# Patient Record
Sex: Male | Born: 1960 | Race: White | Hispanic: No | Marital: Married | Smoking: Never smoker
Health system: Southern US, Community
[De-identification: ages and names within clinical notes are randomized; demographics above are authoritative.]

---

## 1998-10-07 ENCOUNTER — Ambulatory Visit (HOSPITAL_BASED_OUTPATIENT_CLINIC_OR_DEPARTMENT_OTHER): Admission: RE | Admit: 1998-10-07 | Discharge: 1998-10-07 | Payer: Self-pay | Admitting: Urology

## 2000-08-09 ENCOUNTER — Encounter: Admission: RE | Admit: 2000-08-09 | Discharge: 2000-08-09 | Payer: Self-pay | Admitting: Family Medicine

## 2000-08-09 ENCOUNTER — Encounter: Payer: Self-pay | Admitting: Family Medicine

## 2007-03-16 ENCOUNTER — Encounter: Admission: RE | Admit: 2007-03-16 | Discharge: 2007-03-16 | Payer: Self-pay | Admitting: Family Medicine

## 2007-12-21 ENCOUNTER — Ambulatory Visit (HOSPITAL_COMMUNITY): Admission: RE | Admit: 2007-12-21 | Discharge: 2007-12-21 | Payer: Self-pay | Admitting: Family Medicine

## 2012-12-31 ENCOUNTER — Ambulatory Visit (INDEPENDENT_AMBULATORY_CARE_PROVIDER_SITE_OTHER): Payer: BC Managed Care – PPO | Admitting: Sports Medicine

## 2012-12-31 VITALS — BP 123/76 | Ht 72.0 in | Wt 188.0 lb

## 2012-12-31 DIAGNOSIS — M25569 Pain in unspecified knee: Secondary | ICD-10-CM

## 2012-12-31 DIAGNOSIS — G8929 Other chronic pain: Secondary | ICD-10-CM

## 2012-12-31 DIAGNOSIS — Q667 Congenital pes cavus, unspecified foot: Secondary | ICD-10-CM

## 2012-12-31 MED ORDER — MELOXICAM 15 MG PO TABS: ORAL_TABLET | ORAL | Status: DC

## 2012-12-31 NOTE — Progress Notes (Signed)
  Subjective:    Patient ID: Miguel Woods, male    DOB: [birth year], 52 y.o.   MRN: [id number]  HPI  Patient is a 52yo caucasian male who presents today with a 2 month history of left-sided knee pain. Patient reports that he slipped and caught himself, placing all of his weight on his left knee, 2 months ago. No popping or instability occurred after the incident. Pain was localized to the medial aspect of his left knee. His symptoms seemed to get better until a second, similar episode occurred one month later. Since this time the pain has not gone away. He has noticed his left knee stiffens and feels unstable during periods of little use. This diminishes after some ambulation. He denies any swelling or ecchymoses. He reports some mild limping is present regularly. Pain is worse with toe-walking, and better with heel-walking. He has taken some Ibuprofen and Tylenol for this, both of which have helped.  Patient was seen about one year ago by Dr. Margaretha Sheffield for left-sided knee pain. He went through a physical therapy program. He states that he still performs the home exercises on an inconsistent basis. Today patient is interested in insight to his most recent knee pain as well as obtaining some shoe inserts.   Review of Systems Denies fever, chills, HA, cough, SOB    Objective:   Physical Exam General: Patient is a well nourished, well developed 52yo caucasian male who appears of stated age. Alert, oriented, no acute distress. Vitals: Reviewed Hips: Full ROM, sensation intact, muscle strength 5/5 bilaterally. No tenderness or swelling noted. Knees: Full ROM, sensation intact, muscle strength 5/5 bilaterally. No effusion present. ACL/PCL/MCL/LCL intact. Left-sided medial joint line tenderness present. McMurray's and Thessaly tests were positive for mild medial discomfort. Findings are consistent with a medial meniscal tear. Ankles: Full ROM, sensation intact, muscle strength 5/5 bilaterally. No tenderness  or swelling noted. Pes cavus is visibly present. No other gross deformities are noted.       Assessment & Plan:  1) Medial Meniscal Tear (Mild)  - conservative therapy recommended  - home exercises (from prior visits)  - Mobic 15mg  Qdaily X 10days 2) Pes Cavus feet  - shoe inserts 3) Follow-up: 3-4 weeks   - if no improvement consider: MRI

## 2013-02-06 ENCOUNTER — Ambulatory Visit (INDEPENDENT_AMBULATORY_CARE_PROVIDER_SITE_OTHER): Payer: BC Managed Care – PPO | Admitting: Emergency Medicine

## 2013-02-06 VITALS — BP 111/71 | Ht 72.0 in | Wt 188.0 lb

## 2013-02-06 DIAGNOSIS — Q667 Congenital pes cavus, unspecified foot: Secondary | ICD-10-CM

## 2013-02-06 NOTE — Assessment & Plan Note (Signed)
Custom orthotics were made in the office today. He'll follow up for reevaluation in 4 weeks time.

## 2013-02-06 NOTE — Progress Notes (Signed)
Patient ID: Miguel Woods, male   DOB: Oct 12, 1960, 52 y.o.   MRN: [id number]  Is a 52 year old male who presents to the sports medicine clinic today for evaluation and production of custom orthotics. History bilateral knee pain, plantar fasciitis and a cavus foot. The past month he's been wearing a green soles insole with a scaphoid had an  no foot pain or knee pain despite prolonged standing.  ROS:  As per HPI, otherwise negative.  Examination:  BP 111/71  Ht 6' (1.829 m)  Wt 188 lb (85.276 kg)  BMI 25.49 kg/m2  A well-developed well-nourished 52 year old white male awake alert oriented in no acute distress.  Leg lengths equal  Cavus foot bilaterally.  Patient was fitted for a :  semi-rigid orthotic. The orthotic was heated and afterward the patient stood on the orthotic blank positioned on the orthotic stand. The patient was positioned in subtalar neutral position and 10 degrees of ankle dorsiflexion in a weight bearing stance. After completion of molding, a stable base was applied to the orthotic blank. The blank was ground to a stable position for weight bearing. Size: 10 Posting: none Additional orthotic padding: none   Greater than 40 minutes of face-to-face time was spent in evaluation of  this patient and production of custom orthotics.

## 2015-04-26 ENCOUNTER — Emergency Department (HOSPITAL_COMMUNITY)
Admission: EM | Admit: 2015-04-26 | Discharge: 2015-04-26 | Disposition: A | Discharge status: Home / Self Care | Payer: BC Managed Care – PPO | Attending: Emergency Medicine | Admitting: Emergency Medicine

## 2015-04-26 ENCOUNTER — Encounter (HOSPITAL_COMMUNITY): Payer: Self-pay | Admitting: Emergency Medicine

## 2015-04-26 DIAGNOSIS — Y9289 Other specified places as the place of occurrence of the external cause: Secondary | ICD-10-CM | POA: Diagnosis not present

## 2015-04-26 DIAGNOSIS — W260XXA Contact with knife, initial encounter: Secondary | ICD-10-CM | POA: Insufficient documentation

## 2015-04-26 DIAGNOSIS — Z23 Encounter for immunization: Secondary | ICD-10-CM | POA: Insufficient documentation

## 2015-04-26 DIAGNOSIS — Y998 Other external cause status: Secondary | ICD-10-CM | POA: Insufficient documentation

## 2015-04-26 DIAGNOSIS — S61219A Laceration without foreign body of unspecified finger without damage to nail, initial encounter: Secondary | ICD-10-CM

## 2015-04-26 DIAGNOSIS — Y9389 Activity, other specified: Secondary | ICD-10-CM | POA: Insufficient documentation

## 2015-04-26 DIAGNOSIS — S61210A Laceration without foreign body of right index finger without damage to nail, initial encounter: Secondary | ICD-10-CM | POA: Insufficient documentation

## 2015-04-26 MED ORDER — TETANUS-DIPHTH-ACELL PERTUSSIS 5-2.5-18.5 LF-MCG/0.5 IM SUSP
0.5000 mL | Freq: Once | INTRAMUSCULAR | Status: AC
  Administered 2015-04-26: 0.5 mL via INTRAMUSCULAR
  Filled 2015-04-26: qty 0.5

## 2015-04-26 NOTE — Discharge Instructions (Signed)
dermabond will slough off over the next few days which is normal, try not to pull it off. Keep fingers clean and dry until then. Follow-up with your primary care physician if you have an issues.

## 2015-04-26 NOTE — ED Provider Notes (Signed)
CSN: [id number]     Arrival date & time 04/26/15  2004 History   First MD Initiated Contact with Patient 04/26/15 2026     Chief Complaint  Patient presents with  . Extremity Laceration     (Consider location/radiation/quality/duration/timing/severity/associated sxs/prior Treatment) The history is provided by the patient and medical records.     54 y.o. M with no PMH presenting to the ED for laceration to right index finger that occurred while cutting oranges.  He states the knife slipped and he accidentally tried to grab it with his hand, slicing his index finger.  He denies numbness or weakness.  Patient is right hand dominant.  Bleeding well controlled on arrival.  Date of last tetanus unknown.  History reviewed. No pertinent past medical history. History reviewed. No pertinent past surgical history. No family history on file. Social History  Substance Use Topics  . Smoking status: Never Smoker   . Smokeless tobacco: None  . Alcohol Use: Yes    Review of Systems  Skin: Positive for wound.  All other systems reviewed and are negative.     Allergies  Review of patient's allergies indicates no known allergies.  Home Medications   Prior to Admission medications   Medication Sig Start Date End Date Taking? Authorizing Provider  meloxicam (MOBIC) 15 MG tablet Take 1 tablet by mouth with food for 10 days, then take as needed. 12/31/12   Timothy R Draper, DO   BP 124/60 mmHg  Pulse 70  Temp(Src) 97.5 F (36.4 C) (Oral)  Resp 18  Ht 6' (1.829 m)  Wt 92.987 kg  BMI 27.80 kg/m2  SpO2 93%   Physical Exam  Constitutional: He is oriented to person, place, and time. He appears well-developed and well-nourished. No distress.  HENT:  Head: Normocephalic and atraumatic.  Mouth/Throat: Oropharynx is clear and moist.  Eyes: Conjunctivae and EOM are normal. Pupils are equal, round, and reactive to light.  Neck: Normal range of motion. Neck supple.  Cardiovascular: Normal rate,  regular rhythm and normal heart sounds.   Pulmonary/Chest: Breath sounds normal. No respiratory distress. He has no wheezes.  Abdominal: Soft. Bowel sounds are normal. There is no tenderness. There is no guarding.  Musculoskeletal: Normal range of motion. He exhibits no edema.       Right hand: He exhibits laceration. He exhibits normal range of motion, no tenderness, normal capillary refill, no deformity and no swelling. Normal sensation noted. Normal strength noted.       Hands: 1cm superficial laceration to volar aspect of right index finger, just distal to PIP joint; wound has barely lacerated through skin, no tendon or deep vessel involvement; full flexion/extension of fingers maintained; able to fully make fist, normal grip strength; strong radial pulse and cap refill; normal sensation throughout  Neurological: He is alert and oriented to person, place, and time.  Skin: Skin is warm and dry. He is not diaphoretic.  Psychiatric: He has a normal mood and affect.  Nursing note and vitals reviewed.   ED Course  Procedures (including critical care time)  LACERATION REPAIR Performed by: Garlon HatchetSANDERS, Jennfier Abdulla M Authorized by: Garlon HatchetSANDERS, Imad Shostak M Consent: Verbal consent obtained. Risks and benefits: risks, benefits and alternatives were discussed Consent given by: patient Patient identity confirmed: provided demographic data Prepped and Draped in normal sterile fashion Wound explored  Laceration Location: right index finger  Laceration Length: 1 cm, superficial  No Foreign Bodies seen or palpated  Anesthesia: none  Local anesthetic: none  Anesthetic total:  0 ml  Irrigation method: syringe Amount of cleaning: standard  Skin closure: dermabond  Number of sutures: 0  Technique: N/A  Patient tolerance: Patient tolerated the procedure well with no immediate complications.  Labs Review Labs Reviewed - No data to display  Imaging Review No results found. I have personally reviewed  and evaluated these images and lab results as part of my medical decision-making.   EKG Interpretation None      MDM   Final diagnoses:  Laceration of finger, right, initial encounter   54 year old male here with right index finger laceration while cutting oranges. On exam, laceration is very superficial. There is no evidence of tendon, deep vessel, or deep tissue involvement. Patient has full flexion and extension of finger maintained, normal grip strength. Hand is neurovascularly intact. Tetanus was updated.  Laceration repaired with Dermabond with adequate approximation of skin tissue. Dressing applied. Discussed home wound care, will follow-up with PCP if any issues occur.  Discussed plan with patient, he/she acknowledged understanding and agreed with plan of care.  Return precautions given for new or worsening symptoms.    Garlon Hatchet, PA-C 04/26/15 2139  Marily Memos, MD 04/26/15 [phone]

## 2015-04-26 NOTE — ED Notes (Signed)
Pt left at this time with all belongings.  

## 2015-04-26 NOTE — ED Notes (Signed)
Pt arrives with 2cm lac to anterior side of R finger. States injured while cooking dinner. Last tetanus unknown. No change in sensation

## 2015-06-05 ENCOUNTER — Encounter: Payer: Self-pay | Admitting: Sports Medicine

## 2015-06-05 ENCOUNTER — Ambulatory Visit (INDEPENDENT_AMBULATORY_CARE_PROVIDER_SITE_OTHER): Payer: BC Managed Care – PPO | Admitting: Sports Medicine

## 2015-06-05 VITALS — BP 120/80 | Ht 72.0 in | Wt 205.0 lb

## 2015-06-05 DIAGNOSIS — S61219S Laceration without foreign body of unspecified finger without damage to nail, sequela: Secondary | ICD-10-CM | POA: Diagnosis not present

## 2015-06-05 NOTE — Patient Instructions (Signed)
[facility]: Dairl PonderMatthew Weingold MD Tuesday January 17th at 345p [address], [place], [place] [address] Phone: [phone]

## 2015-06-05 NOTE — Progress Notes (Signed)
   Subjective:    Patient ID: Miguel Woods, male    DOB: Nov 13, 1960, 55 y.o.   MRN: [id number]  HPI chief complaint: Right fifth finger pain  Very pleasant 55 year old male comes in today complaining of some slight pain in his right fifth finger along with the inability to flex his finger at the DIP joint. While cooking 5 weeks ago, he cut the palmar aspect of his finger with a knife. He was seen in the emergency room and a laceration repair to the skin was performed but it does not sound like any sort of tendon repair was done. The patient states that he was told that the tendon looked "pretty good". He was also told that he would have some sensory loss in that finger as well as some swelling for the next few weeks. However, as his swelling and pain have improved his ability to flex his DIP joint has not returned. He is having trouble with grip as a result of this. Denies any pain more proximally in the palm of his hand.  Interim medical history reviewed Medications reviewed Allergies reviewed    Review of Systems    as above Objective:   Physical Exam  Well-developed, well-nourished. No acute distress.  Examination of the right hand with attention to the right finger shows no soft tissue swelling. No gross deformity. Patient has active flexion at the MCP and PIP joints but lacks active flexion at the DIP joint. He has full passive flexion of all joints. Extensor tendon is intact. Brisk capillary refill.  MSK ultrasound was performed of the right fifth finger and compared to the left fifth finger. Flexor digitorum superficialis and profundus were well visualized in the left hand however on the right hand I have trouble visualizing them at the PIP joint. It is difficult for me to tell to what degree the injury has taken place. MRI would be helpful.      Assessment & Plan:  Right fifth finger flexor profundus injury  Patient's injury is 55 weeks old. He has definitely injured the  flexor profundus tendon to some degree. He is unable to actively flex his DIP joint. I will refer the patient to Dr.Weingold for further workup and treatment.

## 2017-03-27 ENCOUNTER — Encounter: Payer: Self-pay | Admitting: Sports Medicine

## 2017-03-27 ENCOUNTER — Ambulatory Visit: Payer: BC Managed Care – PPO | Admitting: Sports Medicine

## 2017-03-27 VITALS — BP 114/71 | Ht 72.0 in | Wt 216.0 lb

## 2017-03-27 DIAGNOSIS — M25562 Pain in left knee: Secondary | ICD-10-CM | POA: Diagnosis not present

## 2017-03-27 DIAGNOSIS — Q667 Congenital pes cavus, unspecified foot: Secondary | ICD-10-CM

## 2017-03-27 MED ORDER — MELOXICAM 15 MG PO TABS: ORAL_TABLET | ORAL | 0 refills | Status: AC

## 2017-03-27 NOTE — Progress Notes (Signed)
   Subjective:    Patient ID: Miguel Woods, male    DOB: [birth year], 56 y.o.   MRN: [id number]  HPI chief complaint: Left knee pain  Miguel Woods comes in today complaining of medial sided left knee pain that started a month ago. He had an acute onset of pain while running. Since then, his pain has been intermittent. He describes a sharp stabbing discomfort along the medial knee which seems to be most noticeable with running downhill. He's had similar pain in the past that improved with meloxicam and home exercises. His pain will radiate at times up into his distal femur. He has not noticed any swelling. He does get some stiffness. He wears a compression sleeve when exercising. He denies locking or catching in his knee. He's tried over-the-counter ibuprofen and Tylenol and both have been somewhat helpful.  Past medical history reviewed Medications reviewed Allergies reviewed     Review of Systems As above     Objective:   Physical Exam  Well-developed, well-nourished. No acute distress. Awake alert and oriented 3. Vital signs reviewed  Left knee: Full range of motion. Trace patellofemoral crepitus. No effusion. He is slightly tender to palpation along the medial joint line. No tenderness along the lateral joint line. Negative McMurray's. Negative Thessaly's. No tenderness at the pes anserine bursa. Knee is stable to valgus and varus stressing. Negative anterior drawer, negative posterior drawer. Neurovascularly intact distally. Walking with a slight limp.  Quick bedside ultrasound shows no effusion. He does have a bulging medial meniscus which abuts the medial collateral ligament      Assessment & Plan:  Left knee pain likely secondary to medial meniscal injury  The bulging meniscus on his ultrasound suggest a possible meniscal injury deeper within the knee. Since he has improved with conservative treatment in the past we will start with repeating this. He will start meloxicam 15 mg daily  for 10 days then as needed. He can use his Tylenol as needed for pain but is instructed to stop the ibuprofen. Continue with compression with exercise but I want him to avoid running until follow-up with me in 4 weeks. He also has a cavus foot and we've made him custom orthotics in the past. I'm going to give him some new green sports insoles with scaphoid pads and we will consider new custom orthotics at follow-up. I also discussed the possibility of imaging, specifically an MRI, if his symptoms do not improve. He will call with questions or concerns prior to his follow-up visit.

## 2017-04-25 ENCOUNTER — Ambulatory Visit: Payer: BC Managed Care – PPO | Admitting: Sports Medicine

## 2017-04-25 VITALS — BP 126/72 | Ht 72.0 in | Wt 220.0 lb

## 2017-04-25 DIAGNOSIS — Q667 Congenital pes cavus, unspecified foot: Secondary | ICD-10-CM

## 2017-04-25 DIAGNOSIS — M25562 Pain in left knee: Secondary | ICD-10-CM

## 2017-04-25 NOTE — Progress Notes (Signed)
Subjective:    Patient ID: Miguel Woods, male    DOB: 1960/12/15, 56 y.o.   MRN: [id number]  Mr. Miguel Woods presents to the clinic today for a 4 week follow up. At his last visit with Dr. Margaretha Sheffieldraper he was diagnosed with a left sided medial meniscal injury that was confirmed with bedside ultrasound. At that visit he was given green insoles which have been helping. He especially notices when he doesn't wear them that it causes some discomfort. He took the Meloxicam for about 10 days and hasn't required it since. His biggest complaint today is that when he is getting out of the car or a tight space and he has to twist he will feel a sharp pinch but it only lasts a couple of seconds. He has not attempted running yet because he has been apprehensive. He is open to getting an MRI or continuing with the conservative route and getting custom orthotics done today. Denies any swelling or fevers at home.      Review of Systems  Constitutional: Negative.   HENT: Negative.   Eyes: Negative.   Respiratory: Negative.   Cardiovascular: Negative.   Gastrointestinal: Negative.   Endocrine: Negative.   Genitourinary: Negative.   Musculoskeletal: Positive for arthralgias (left knee pain). Negative for joint swelling.  Skin: Negative.   Allergic/Immunologic: Negative.   Neurological: Negative.   Hematological: Negative.   Psychiatric/Behavioral: Negative.        Objective:   Physical Exam  Constitutional: He is oriented to person, place, and time. He appears well-developed and well-nourished. No distress.  HENT:  Head: Normocephalic and atraumatic.  Eyes: Conjunctivae and EOM are normal. Pupils are equal, round, and reactive to light.  Neck: Normal range of motion. Neck supple. No tracheal deviation present.  Pulmonary/Chest: Effort normal. No stridor.  Neurological: He is alert and oriented to person, place, and time.  Skin: Skin is warm and dry. He is not diaphoretic.  Psychiatric: He has a normal  mood and affect. His behavior is normal.   MSK (Left Knee): No obvious deformity or swelling noted with the left knee when compared to the right. No point tenderness along the medial or lateral joint line to deep palpation. No tenderness to palpation over the patella. Full ROM with trace crepitus of the left knee. Negative McMurray's test (no pain with twisting of the knee). Negative Thessaly test (no pain with twisting while standing). Negative Lachman's test. Negative anterior, posterior drawer test. Good stability noted in valgus and varus stress testing. Gait is normal. (bilateral feet): Significant Pes Cavus noted bilaterally.          Assessment & Plan:  Intermittent Left Knee pain secondary to Medial Meniscal injury, subsequent Pes Cavus  As evidenced by previous ultrasound findings and physical exam. Stable. Improved with standard orthotics. Fitted for custom shoe orthotics today. Recommended a gradual plan to get back into running and slowly adding distance up until 3 miles. -Alternate Walking/Running as tolerated and slowly up titrate distance -Can RTC for adjustments of orthotics. -Call if symptoms persist or worsen and I will consider further diagnostic imaging  Patient was fitted for a : standard, cushioned, semi-rigid orthotic. The orthotic was heated and afterward the patient stood on the orthotic blank positioned on the orthotic stand. The patient was positioned in subtalar neutral position and 10 degrees of ankle dorsiflexion in a weight bearing stance. After completion of molding, a stable base was applied to the orthotic blank. The blank was ground to  a stable position for weight bearing. Size: 11 Base: Blue EVA Posting: none  Additional orthotic padding: none  Total time spent with the patient was 30 minutes with greater than 50% of the time spent in face-to-face consultation discussing treatment going forward as well as orthotic construction, instruction, and  fitting. Patient found the orthotics to be comfortable prior to leaving the office. Gait was neutralized with orthotics in place.

## 2017-04-26 ENCOUNTER — Encounter: Payer: Self-pay | Admitting: Sports Medicine

## 2017-08-07 ENCOUNTER — Ambulatory Visit: Payer: BC Managed Care – PPO | Admitting: Sports Medicine

## 2017-08-07 ENCOUNTER — Encounter: Payer: Self-pay | Admitting: Sports Medicine

## 2017-08-07 VITALS — BP 110/82 | Ht 74.0 in | Wt 221.0 lb

## 2017-08-07 DIAGNOSIS — G8929 Other chronic pain: Secondary | ICD-10-CM | POA: Diagnosis not present

## 2017-08-07 DIAGNOSIS — M25512 Pain in left shoulder: Secondary | ICD-10-CM

## 2017-08-07 DIAGNOSIS — R29898 Other symptoms and signs involving the musculoskeletal system: Secondary | ICD-10-CM

## 2017-08-07 MED ORDER — METHYLPREDNISOLONE ACETATE 40 MG/ML IJ SUSP
40.0000 mg | Freq: Once | INTRAMUSCULAR | Status: AC
  Administered 2017-08-07: 40 mg via INTRA_ARTICULAR

## 2017-08-07 NOTE — Assessment & Plan Note (Signed)
Acute on chronic.  Does have neurological involvement with weakness and paresthesia particularly with C5-C6 distribution.  Differential includes shoulder impingement or cervical spine impingement.  Difficult to ascertain based on exam.  Will trial steroid injection per patient request in hopes of alleviation of pain if shoulder.  May need to consider further imaging if unchanged following injection. - Administered methylprednisolone injection of left shoulder - Advised patient to take NSAIDs as needed if injection does not improve pain - RTC 2 weeks

## 2017-08-07 NOTE — Progress Notes (Signed)
Subjective   Patient ID: Miguel NoseClay E Genther    DOB: 1960-05-28, 57 y.o. male   MRN: [id number]  CC: "Left shoulder pain"  HPI: Miguel Woods is a 57 y.o. male who presents to clinic today for the following:  Left shoulder pain: Onset 6 months with gradual worsening particularly with new onset weakness and numbness.  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  Patient is right-handed.  He works as a Radio producercollege professor.  He has been trying Tylenol for relief without improvement.  Patient states his pain is constant and is not aggravated with movement of the shoulder.  His numbness is localized to the palmar aspect of the thumb and radiates up his lateral forearm.  Patient states his weakness is particularly with picking things up.  He denies any fevers or chills, restriction in range of motion.  ROS: see HPI for pertinent.  PMFSH: History of right labral tear.  Surgical history right shoulder rotator cuff.  Family history unremarkable.  Smoking status reviewed. Medications reviewed.  Objective   BP 110/82   Ht 6\' 2"  (1.88 m)   Wt 221 lb (100.2 kg)   BMI 28.37 kg/m  Vitals and nursing note reviewed.  General: well nourished, well developed, NAD with non-toxic appearance HEENT: normocephalic, atraumatic, moist mucous membranes Neck: supple, non-tender without lymphadenopathy, negative Spurling test Cardiovascular: 2+ radial pulse bilaterally Lungs: normal work of breathing Skin: warm, dry, no rashes or lesions, cap refill < 2 seconds MSK: left shoulder with no obvious of deformities, there is moderate tenderness particularly at the proximal insertion site of the biceps, diminished sensation on palmar aspect of left thumb distal forearm consistent with C6 distribution, active and passive range of motion intact without tenderness, motor strength 4/5 with abduction and flexion of left shoulder compared to right, 2+ DTR throughout, positive empty  can test on left  Procedure Note: Left Subacromial Injection Patient was given informed consent, signed copy in the chart. Appropriate time out was taken. Area prepped and draped in usual sterile fashion. 10 cc of methylprednisolone 40 mg/ml was injected into the subacromion using a posterior approach. The patient tolerated the procedure well. There were no complications. Post procedure instructions were given.  Assessment & Plan   Chronic pain in left shoulder Acute on chronic.  Does have neurological involvement with weakness and paresthesia particularly with C5-C6 distribution.  Differential includes shoulder impingement or cervical spine impingement.  Difficult to ascertain based on exam.  Will trial steroid injection per patient request in hopes of alleviation of pain if shoulder.  May need to consider further imaging if unchanged following injection. - Administered methylprednisolone injection of left shoulder - Advised patient to take NSAIDs as needed if injection does not improve pain - RTC 2 weeks  No orders of the defined types were placed in this encounter.  Meds ordered this encounter  Medications  . methylPREDNISolone acetate (DEPO-MEDROL) injection 40 mg    Durward Parcelavid McMullen, DO [facility], PGY-2 08/07/2017, 9:54 AM   Patient seen and evaluated with the resident. I agree with the above plan of care. Patient has definite weakness with resisted supraspinatus and shoulder abduction. Slight weakness with resisted elbow flexion as well. Numbness along the C6 dermatome in his hand. Some of his symptoms may be related to his shoulder but I cannot rule out cervical spine pathology. For diagnostic as well as therapeutic reasons, we performed a subacromial cortisone injection and the patient  will follow-up in 2 weeks. If pain or weakness persists then we will need to consider imaging of both his shoulder and his cervical spine. Patient may use meloxicam intermittently in the  meantime. Call with questions or concerns prior to his follow-up visit.

## 2017-08-21 ENCOUNTER — Ambulatory Visit
Admission: RE | Admit: 2017-08-21 | Discharge: 2017-08-21 | Disposition: A | Discharge status: Home / Self Care | Payer: BC Managed Care – PPO | Source: Ambulatory Visit | Attending: Sports Medicine | Admitting: Sports Medicine

## 2017-08-21 ENCOUNTER — Ambulatory Visit (INDEPENDENT_AMBULATORY_CARE_PROVIDER_SITE_OTHER): Payer: BC Managed Care – PPO | Admitting: Sports Medicine

## 2017-08-21 VITALS — BP 112/78 | Ht 72.0 in | Wt 220.0 lb

## 2017-08-21 DIAGNOSIS — M5412 Radiculopathy, cervical region: Secondary | ICD-10-CM

## 2017-08-21 DIAGNOSIS — R29898 Other symptoms and signs involving the musculoskeletal system: Secondary | ICD-10-CM | POA: Diagnosis not present

## 2017-08-21 NOTE — Progress Notes (Signed)
   Subjective:    Patient ID: Miguel Woods, male    DOB: 06/06/1960, 57 y.o.   MRN: [id number]  HPI   Miguel Woods comes in today for follow-up. Shoulder pain has improved after recent subacromial cortisone injection. Main complaint is persistent weakness, especially in the biceps muscle. He describes fatigue as well. Also has persistent numbness and tingling into the thumb. He has noticed some neck pain as well. He is right-hand dominant.    Review of Systems As above    Objective:   Physical Exam  Well-developed, well-nourished. No acute distress. Awake alert and oriented 3. Vital signs reviewed  Left shoulder: Full painless range of motion.Does have some slight weakness with resisted supraspinatus but no pain. Full strength against resisted external rotation and internal rotation.  Neurological exam: 4+/5 strength with resisted biceps on the left compared to 5/5 on the right. He also has a slightly diminished brachial radialis reflex on the left compared to the right. No atrophy. Some weakness with resisted abduction. Decreased sensation along the left thumb. Good pulses.  X-rays of the cervical spine show multilevel degenerative disc disease. Nothing acute      Assessment & Plan:   Left arm weakness and numbness worrisome for cervical disc herniation  MRI of the cervical spine specifically to rule out a cervical disc herniation. Phone follow-up with those results when available. We will delineate further treatment to include possible cervical ESI or neurosurgical consultation depending on those results. At this point in time, I think the weakness on supraspinatus stressing is due to his cervical spine degenerative disc disease. However, we may need to consider further diagnostic imaging of his shoulder if the MRI of his cervical spine does not reveal an obvious cause for his symptoms.

## 2017-08-23 ENCOUNTER — Ambulatory Visit
Admission: RE | Admit: 2017-08-23 | Discharge: 2017-08-23 | Disposition: A | Discharge status: Home / Self Care | Payer: BC Managed Care – PPO | Source: Ambulatory Visit | Attending: Sports Medicine | Admitting: Sports Medicine

## 2017-08-23 DIAGNOSIS — R29898 Other symptoms and signs involving the musculoskeletal system: Secondary | ICD-10-CM

## 2017-08-23 DIAGNOSIS — M5412 Radiculopathy, cervical region: Secondary | ICD-10-CM

## 2017-08-24 ENCOUNTER — Telehealth: Payer: Self-pay | Admitting: Sports Medicine

## 2017-08-24 ENCOUNTER — Other Ambulatory Visit: Payer: Self-pay

## 2017-08-24 DIAGNOSIS — R29898 Other symptoms and signs involving the musculoskeletal system: Secondary | ICD-10-CM

## 2017-08-24 DIAGNOSIS — M25512 Pain in left shoulder: Principal | ICD-10-CM

## 2017-08-24 DIAGNOSIS — G8929 Other chronic pain: Secondary | ICD-10-CM

## 2017-08-24 NOTE — Telephone Encounter (Signed)
I spoke with Miguel Woods on the phone today after reviewing the MRI of his cervical spine. Although he does have some multilevel degenerative changes,I do not see a definitive left-sided cervical disc herniation that would explain his shoulder and arm weakness. Therefore, we will get an MRI scan of his left shoulder specifically to rule out a rotator cuff tear that may need surgical repair. Phone follow-up with those findings when available. We will delineate further treatment based on those results.

## 2017-08-24 NOTE — Telephone Encounter (Signed)
Please call him back regarding MRI results, thanks.

## 2017-08-27 ENCOUNTER — Other Ambulatory Visit: Payer: BC Managed Care – PPO

## 2017-08-29 ENCOUNTER — Telehealth: Payer: Self-pay

## 2017-08-29 DIAGNOSIS — M25512 Pain in left shoulder: Principal | ICD-10-CM

## 2017-08-29 DIAGNOSIS — G8929 Other chronic pain: Secondary | ICD-10-CM

## 2017-08-29 NOTE — Telephone Encounter (Signed)
Order placed for x-ray of left shoulder. Pt will re-schedule MRI after he gets the x-ray done. Pt understands.

## 2017-08-30 ENCOUNTER — Ambulatory Visit
Admission: RE | Admit: 2017-08-30 | Discharge: 2017-08-30 | Disposition: A | Discharge status: Home / Self Care | Payer: BC Managed Care – PPO | Source: Ambulatory Visit | Attending: Sports Medicine | Admitting: Sports Medicine

## 2017-08-30 DIAGNOSIS — M25512 Pain in left shoulder: Principal | ICD-10-CM

## 2017-08-30 DIAGNOSIS — G8929 Other chronic pain: Secondary | ICD-10-CM

## 2017-09-02 ENCOUNTER — Ambulatory Visit
Admission: RE | Admit: 2017-09-02 | Discharge: 2017-09-02 | Disposition: A | Discharge status: Home / Self Care | Payer: BC Managed Care – PPO | Source: Ambulatory Visit | Attending: Sports Medicine | Admitting: Sports Medicine

## 2017-09-02 DIAGNOSIS — M25512 Pain in left shoulder: Principal | ICD-10-CM

## 2017-09-02 DIAGNOSIS — G8929 Other chronic pain: Secondary | ICD-10-CM

## 2017-09-02 DIAGNOSIS — R29898 Other symptoms and signs involving the musculoskeletal system: Secondary | ICD-10-CM

## 2017-09-11 ENCOUNTER — Other Ambulatory Visit: Payer: Self-pay

## 2017-09-11 ENCOUNTER — Telehealth: Payer: Self-pay | Admitting: Sports Medicine

## 2017-09-11 DIAGNOSIS — M25512 Pain in left shoulder: Principal | ICD-10-CM

## 2017-09-11 DIAGNOSIS — G8929 Other chronic pain: Secondary | ICD-10-CM

## 2017-09-11 NOTE — Telephone Encounter (Signed)
  I spoke with Miguel Woods on the phone today after reviewing the MRI of his left shoulder. 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 I will refer him to Dr. Dion SaucierLandau to discuss further workup and treatment. Follow-up with me when necessary.

## 2018-12-05 IMAGING — MR MR CERVICAL SPINE W/O CM
5 of 6 series · 28 of 48 positions shown · non-contrast
Comparison: 08/21/2017 cervical radiographs

CLINICAL DATA: 56 y/o M; neck pain radiating down the left side
into the left arm with weakness and numbness for 2-3 months.

EXAM:
MRI CERVICAL SPINE WITHOUT CONTRAST
TECHNIQUE: Multiplanar, multisequence MR imaging of the cervical spine was
performed. No intravenous contrast was administered.

[Series 2: T2 · sagittal · 3.0mm · 0.66mm/px · 6 of 15 slices shown (1 of 2)]
[im 1/15]
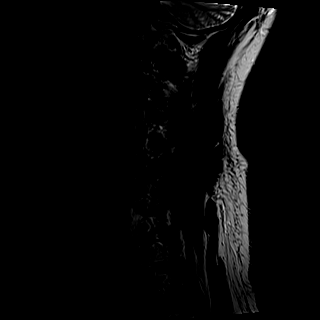
[im 3/15]
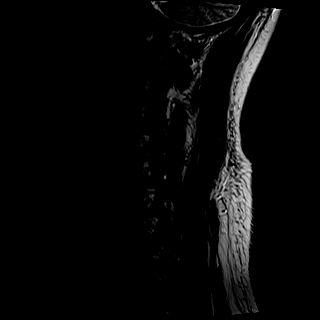
[im 6/15]
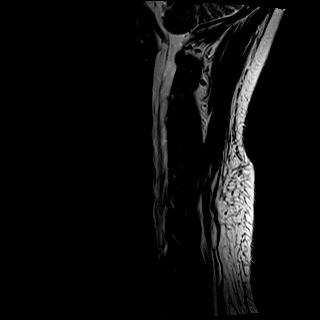
[im 9/15]
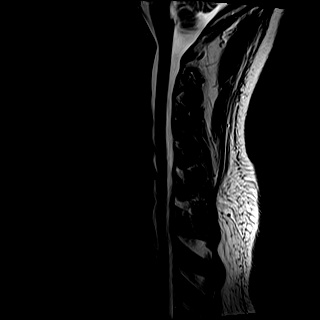
[im 12/15]
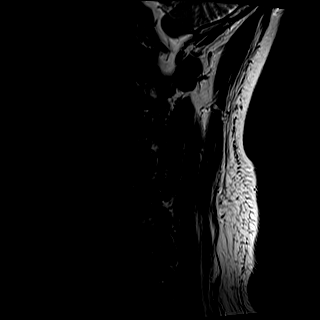
[im 15/15]
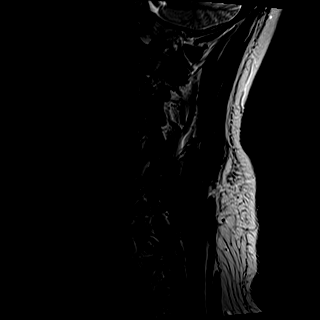

[Series 3: STIR · sagittal · 3.0mm · 0.41mm/px · 6 of 15 slices shown (1 of 2)]
[im 1/15]
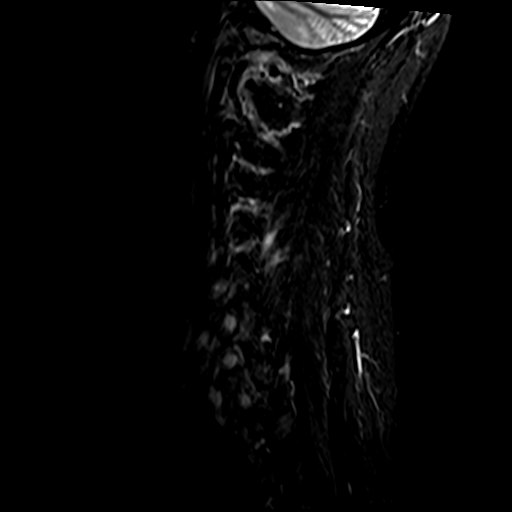
[im 3/15]
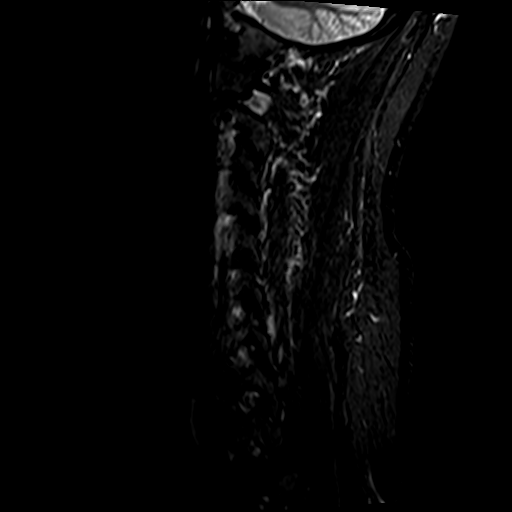
[im 6/15]
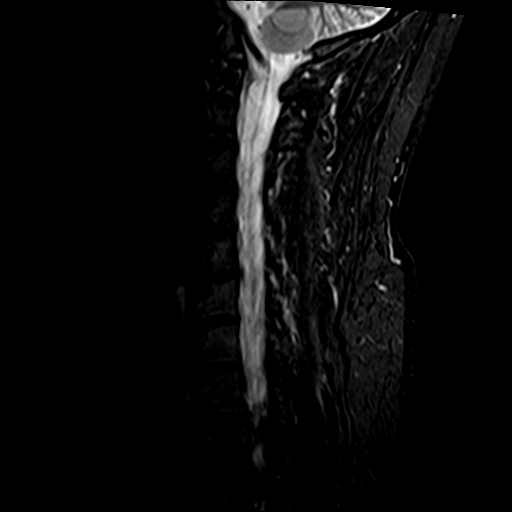
[im 9/15]
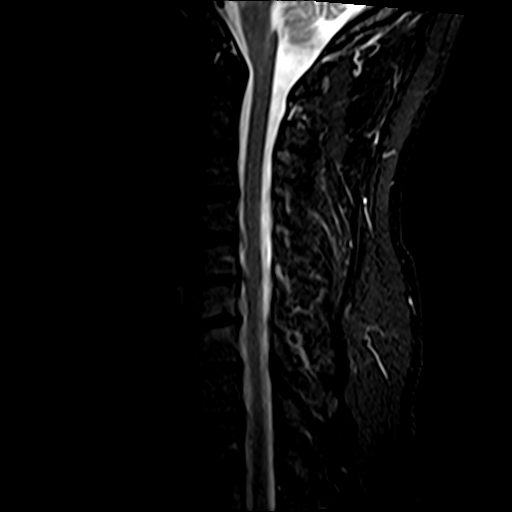
[im 12/15]
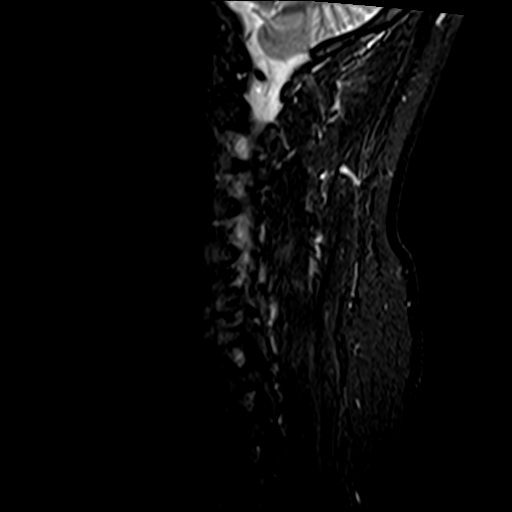
[im 15/15]
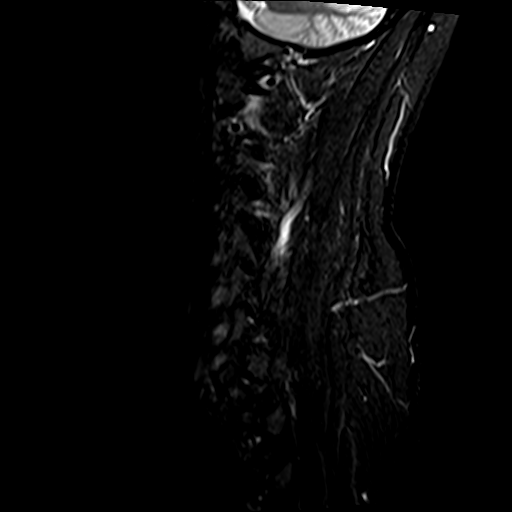

[Series 4: T1 · sagittal · 3.0mm · 0.41mm/px · 6 of 15 slices shown]
[im 1/15]
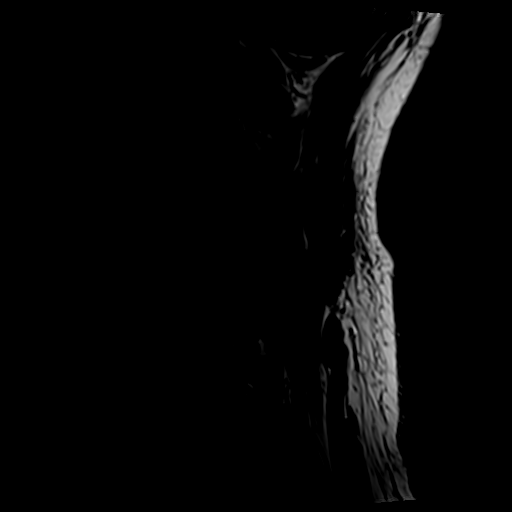
[im 3/15]
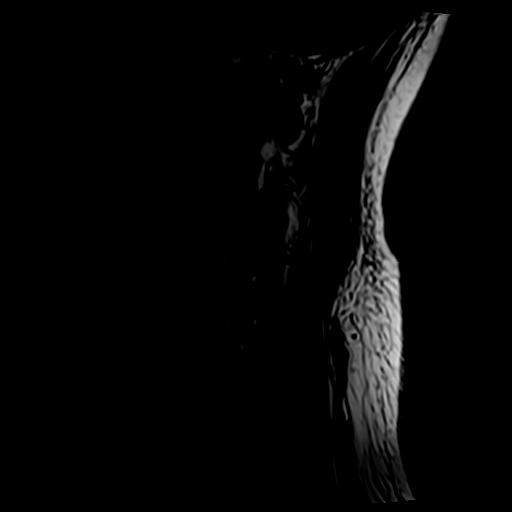
[im 6/15]
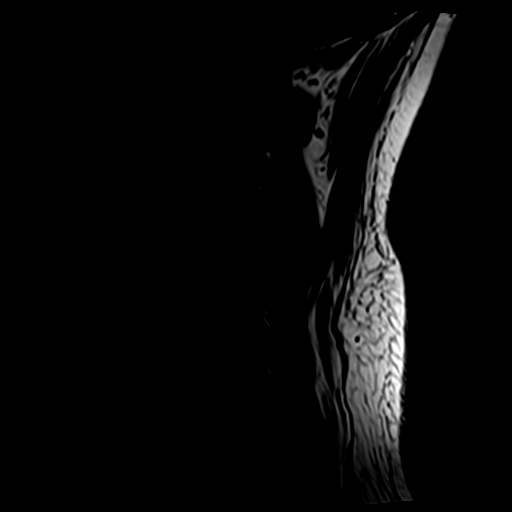
[im 9/15]
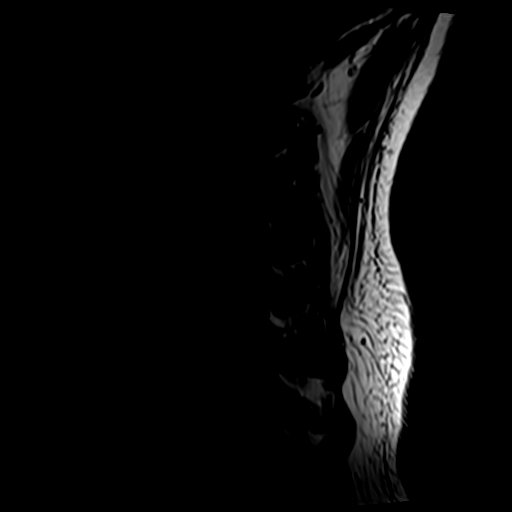
[im 12/15]
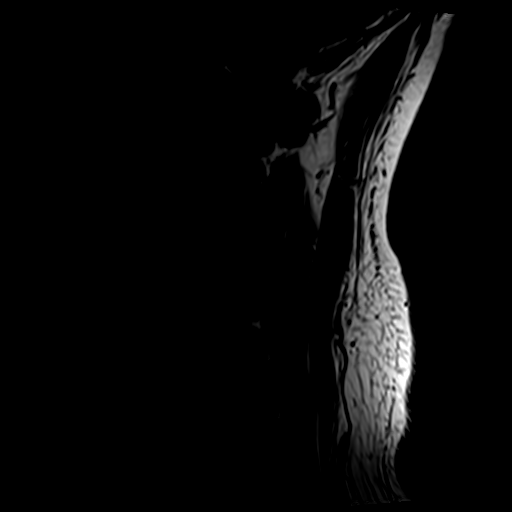
[im 15/15]
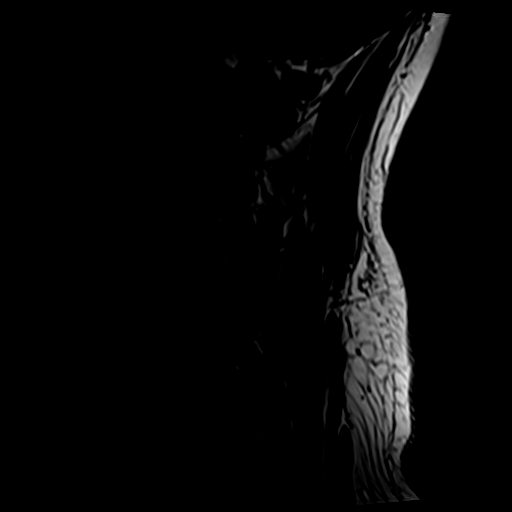

[Series 6: T2 · axial · 3.0mm · 0.70mm/px · z∈[-41,+58]mm · 9 of 28 slices shown (2 of 2)]
[im 1/28]
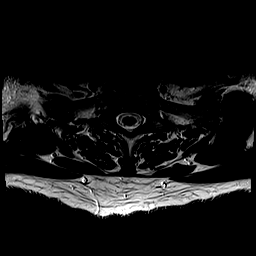
[im 5/28]
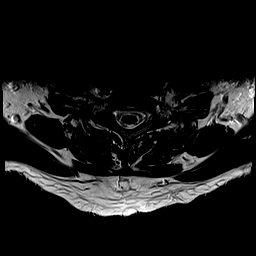
[im 8/28]
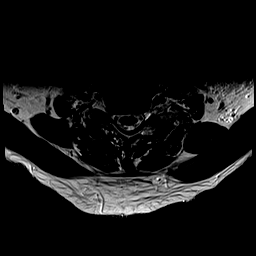
[im 13/28]
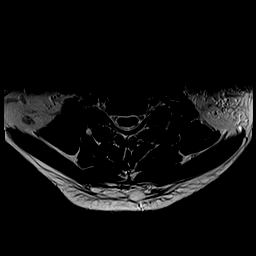
[im 15/28]
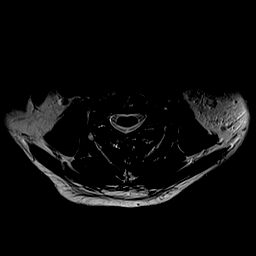
[im 20/28]
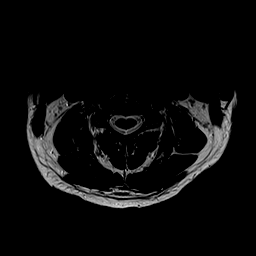
[im 23/28]
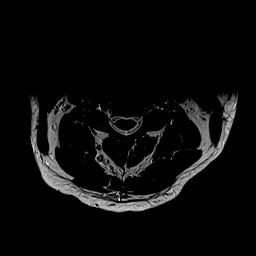
[im 25/28]
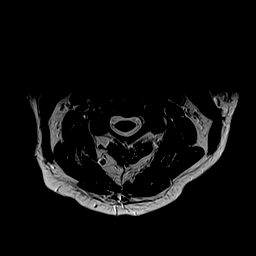
[im 28/28]
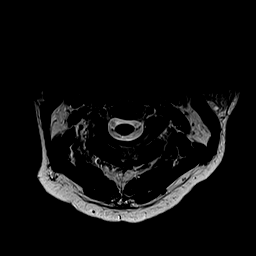

[Series 7: STIR · sagittal · 3.0mm · 0.41mm/px · 1 of 15 slices shown (2 of 2)]
[im 1/15]
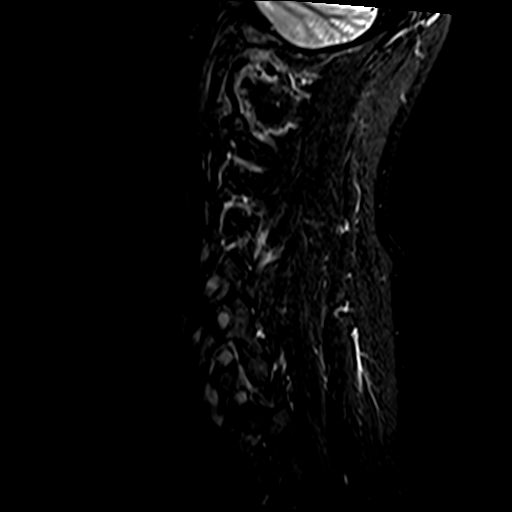

[28 of 48 positions shown; findings below may reference images not displayed]

FINDINGS: Alignment: Straightening of cervical lordosis, no listhesis.

Vertebrae: No fracture, evidence of discitis, or bone lesion. Mild
degenerative endplate edema within the right aspect of the C6-7
opposing endplates.

Cord: No abnormal cord signal.

Posterior Fossa, vertebral arteries, paraspinal tissues: Negative.

Disc levels:

C2-3: No significant disc displacement, foraminal stenosis, or canal
stenosis.

C3-4: Mild disc osteophyte complex with left-greater-than-right
uncovertebral and facet hypertrophy. Mild left foraminal stenosis.

C4-5: Tiny central disc protrusion with ventral thecal sac
effacement and anterior cord contact. No significant foraminal or
canal stenosis.

C5-6: Mild osteophyte complex eccentric to the right with right
greater than left uncovertebral and facet hypertrophy. Mild
right-sided foraminal stenosis. Right ventral thecal sac effacement
and right anterior cord contact and minimal flattening. No canal
stenosis.

C6-7: Mild disc osteophyte complex eccentric to the right with
predominantly right-sided uncovertebral and facet hypertrophy.
Moderate right foraminal stenosis. No left foraminal or canal
stenosis.

C7-T1: No significant disc displacement, foraminal stenosis, or
canal stenosis.
IMPRESSION: 1. Cervical spondylosis with predominantly discogenic degenerative
changes greatest at the C6-7 level.
2. Moderate right C6-7 foraminal stenosis. Multilevel mild foraminal
stenosis.
3. No significant canal stenosis. C4-5 and C5-6 disc bulges efface
the ventral thecal sac with mild anterior cord contact.

By: Don Lolito Onacram M.D.

## 2020-10-08 ENCOUNTER — Ambulatory Visit: Payer: BC Managed Care – PPO | Admitting: Sports Medicine

## 2020-10-08 ENCOUNTER — Encounter: Payer: Self-pay | Admitting: Sports Medicine

## 2020-10-08 ENCOUNTER — Other Ambulatory Visit: Payer: Self-pay

## 2020-10-08 VITALS — BP 108/72 | Ht 72.0 in | Wt 210.0 lb

## 2020-10-08 DIAGNOSIS — M84361A Stress fracture, right tibia, initial encounter for fracture: Secondary | ICD-10-CM

## 2020-10-08 NOTE — Progress Notes (Signed)
   Subjective:    Patient ID: Miguel Woods, male    DOB: 01-Feb-1961, 60 y.o.   MRN: [id number]  HPI chief complaint: Right lower leg pain  Miguel Woods comes in today with concerns about a possible stress fracture in his distal right tibia.  He enjoys running anywhere between 9 and 15 miles a week.  He has been running since 2020.  He denies any significant increase in mileage but about a month ago began running hills and trails.  Around that same time, he began to experience some medial sided lower leg pain.  Most recently, he has been walking on a treadmill and not running and his pain is improving.  He denies pain with walking.  Denies similar pain in the past but he does have a history of right ankle issues and posterior tibialis tendon pain but his current symptoms are different than what he has experienced with those in the past.  He has not noticed any swelling.  We have seen him in the past for custom orthotics but it was many years ago.  He denies pain elsewhere currently.  Interim medical history reviewed Medications reviewed Allergies reviewed    Review of Systems    As above Objective:   Physical Exam  Well-developed, well-nourished.  No acute distress  Right lower leg: Patient is tender to palpation along the distal third of the tibia approximately 2 inches above the medial malleolus.  There is no swelling.  He is tender to palpation to percussion as well.  Full ankle range of motion.  No effusion.  Good pulses.  Walking without a noticeable limp.  Limited [facility] ultrasound of the right lower leg does show an area of cortical defect in the distal third of the tibia consistent with a small stress fracture.  This is best seen in the long axis view.  On short axis view, I do not appreciate the defect but there is neovascularity representing bony injury in this location.      Assessment & Plan:   Right lower leg pain secondary to distal tibial stress fracture  Miguel Woods has no pain with  walking.  He has been walking on a treadmill without any problems.  I think he may continue with this but we will give him a long Aircast to wear when walking for exercise.  He does not need to wear it with simple every day walking.  He was also provided with a copy of the up-to-date return to running protocol and I think he will probably be able to start with phase 2.  He will follow-up with me again in 4 to 6 weeks for reevaluation.  He will not need a repeat ultrasound at that visit, but we will instead plan on making him new custom orthotics.  He will call with questions or concerns in the interim.

## 2020-11-05 ENCOUNTER — Ambulatory Visit: Payer: BC Managed Care – PPO | Admitting: Sports Medicine

## 2020-11-05 ENCOUNTER — Other Ambulatory Visit: Payer: Self-pay

## 2020-11-05 VITALS — BP 102/62 | Ht 72.0 in | Wt 210.0 lb

## 2020-11-05 DIAGNOSIS — M84361A Stress fracture, right tibia, initial encounter for fracture: Secondary | ICD-10-CM | POA: Diagnosis not present

## 2020-11-06 NOTE — Progress Notes (Signed)
Patient ID: Miguel Woods, male   DOB: 1960-07-09, 60 y.o.   MRN: [id number]  [name] presents today for custom orthotics.  He was last seen in the office on May 19 and diagnosed with a distal tibial stress fracture.  He has been following the return to running protocol and doing well.  Walking without a limp.  Custom orthotics were created as below.  He found them to be comfortable prior to leaving the office.  Gait was neutral with orthotics in place.  We will continue to follow the return to running protocol for distal tibial stress fractures and will follow-up with me for ongoing or recalcitrant issues.  Patient was fitted for a : standard, cushioned, semi-rigid orthotic. The orthotic was heated and afterward the patient stood on the orthotic blank positioned on the orthotic stand. The patient was positioned in subtalar neutral position and 10 degrees of ankle dorsiflexion in a weight bearing stance. After completion of molding, a stable base was applied to the orthotic blank. The blank was ground to a stable position for weight bearing. Size: 10 Base: Blue EVA Posting: none Additional orthotic padding: none

## 2023-09-13 ENCOUNTER — Other Ambulatory Visit (HOSPITAL_COMMUNITY): Payer: Self-pay | Admitting: Family Medicine

## 2023-09-13 DIAGNOSIS — E782 Mixed hyperlipidemia: Secondary | ICD-10-CM

## 2023-09-21 ENCOUNTER — Ambulatory Visit (HOSPITAL_COMMUNITY)
Admission: RE | Admit: 2023-09-21 | Discharge: 2023-09-21 | Disposition: A | Discharge status: Home / Self Care | Payer: Self-pay | Source: Ambulatory Visit | Attending: Family Medicine | Admitting: Family Medicine

## 2023-09-21 DIAGNOSIS — E782 Mixed hyperlipidemia: Secondary | ICD-10-CM | POA: Insufficient documentation
